# Patient Record
Sex: Female | Born: 2005 | Race: Black or African American | Hispanic: No | Marital: Single | Smoking: Never smoker
Health system: Southern US, Community
[De-identification: ages and names within clinical notes are randomized; demographics above are authoritative.]

## PROBLEM LIST (undated history)

## (undated) DIAGNOSIS — T7840XA Allergy, unspecified, initial encounter: Secondary | ICD-10-CM

## (undated) DIAGNOSIS — D573 Sickle-cell trait: Secondary | ICD-10-CM

## (undated) DIAGNOSIS — L309 Dermatitis, unspecified: Secondary | ICD-10-CM

## (undated) HISTORY — DX: Sickle-cell trait: D57.3

## (undated) HISTORY — DX: Dermatitis, unspecified: L30.9

## (undated) HISTORY — DX: Allergy, unspecified, initial encounter: T78.40XA

---
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
## 2024-01-13 ENCOUNTER — Encounter: Payer: Self-pay | Admitting: Dermatology

## 2024-01-13 DIAGNOSIS — L209 Atopic dermatitis, unspecified: Secondary | ICD-10-CM

## 2024-01-15 MED ORDER — CIBINQO 100 MG PO TABS: 100.0000 mg | ORAL_TABLET | Freq: Every day | ORAL | 6 refills | Status: AC

## 2024-01-21 ENCOUNTER — Ambulatory Visit (INDEPENDENT_AMBULATORY_CARE_PROVIDER_SITE_OTHER): Admitting: Dermatology

## 2024-01-21 DIAGNOSIS — Z79899 Other long term (current) drug therapy: Secondary | ICD-10-CM | POA: Diagnosis not present

## 2024-01-21 DIAGNOSIS — L650 Telogen effluvium: Secondary | ICD-10-CM

## 2024-01-21 DIAGNOSIS — Z7189 Other specified counseling: Secondary | ICD-10-CM

## 2024-01-21 DIAGNOSIS — L2089 Other atopic dermatitis: Secondary | ICD-10-CM

## 2024-01-21 DIAGNOSIS — L819 Disorder of pigmentation, unspecified: Secondary | ICD-10-CM

## 2024-01-21 NOTE — Progress Notes (Unsigned)
 Follow-Up Visit   Subjective  Samantha Thompson is a 18 y.o. female who presents for the following:  Atopic derm followup reports has been using cibinqo for over a month noticed improvement on arms and legs  Also less itchy  No new flares Has not noticed side effects   Photos today  Will recheck labs at next followup  Cmp cbc and lipid  The following portions of the chart were reviewed this encounter and updated as appropriate: medications, allergies, medical history  Review of Systems:  No other skin or systemic complaints except as noted in HPI or Assessment and Plan.  Objective  Well appearing patient in no apparent distress; mood and affect are within normal limits.   A focused examination was performed of the following areas: ***  Relevant exam findings are noted in the Assessment and Plan.    Assessment & Plan    ATOPIC DERMATITIS, severe even on [name]  injections - reviewed labs from 07/2023   Exam: dyschromia on the lower legs and arms with lichenification and scale.  40% BSA while on [name] .   Chronic and persistent condition with duration or expected duration over one year. Condition is symptomatic/ bothersome to patient. Not currently at goal.   Atopic dermatitis (eczema) is a chronic, relapsing, pruritic condition that can significantly affect quality of life. It is often associated with allergic rhinitis and/or asthma and can require treatment with topical medications, phototherapy, or in severe cases biologic injectable medication ([name] ; Adbry) or Oral JAK inhibitors.   Treatment Plan: Continue [name]  300mg /32mL SQ Q2W. Dupilumab  ([name] ) is a treatment given by injection for adults and children with moderate-to-severe atopic dermatitis. Goal is control of skin condition, not cure. It is given as 2 injections at the first dose followed by 1 injection ever 2 weeks thereafter.  Young children are dosed monthly.   Potential side effects include  allergic reaction, herpes infections, injection site reactions and conjunctivitis (inflammation of the eyes).  The use of [name]  requires long term medication management, including periodic office visits.   Continue [name] 0.1% cream to aa's QD-BID PRN. Topical steroids (such as triamcinolone , fluocinolone, fluocinonide, mometasone, clobetasol , halobetasol, betamethasone, hydrocortisone ) can cause thinning and lightening of the skin if they are used for too long in the same area. Your physician has selected the right strength medicine for your problem and area affected on the body. Please use your medication only as directed by your physician to prevent side effects.    Recommend gentle skin care.   Discussed Cibinqo and Rinvoq since patient continues to flare on treatment. Discussed potential side effects, and the need for screening labs. Pt would like more information about medication before deciding. Pamphlets given for Cibinqo and Rinvoq. Reviewed recent lab work and should patient want to move forward with either medications she would need a CMP, Quanti-feron, and Hep B. Pt will contact us  via MyChart if she would like to change medication.  TELOGEN EFFLUVIUM Secondary to recent severe flare of atopic dermatitis that occurred when patient was off [name]  injections.  Her hair growth has significantly improved that she has been on [name]  for a while and she is pleased.  We had done thyroid  screening and other lab screening and it was all okay. Exam: Diffuse thinning of hair, stable since last visit   Telogen effluvium is a benign, self-limited condition causing increased hair shedding usually for several months. It does not progress to baldness, and the hair eventually grows back on its own. It can  be triggered by recent illness, recent surgery, thyroid  disease, low iron stores, vitamin D  deficiency, fad diets or rapid weight loss, hormonal changes such as pregnancy or birth control pills, and some  medication. Usually the hair loss starts 2-3 months after the illness or health change. Rarely, it can continue for longer than a year. Treatments options may include oral or topical Minoxidil; Red Light scalp treatments; Biotin 2.5 mg daily and other options.   No treatment    No follow-ups on file.  IEleanor Blush, [name], am acting as scribe for Alm Rhyme, MD.   Documentation: I have reviewed the above documentation for accuracy and completeness, and I agree with the above.  Alm Rhyme, MD

## 2024-01-21 NOTE — Patient Instructions (Signed)

## 2024-01-22 ENCOUNTER — Encounter: Payer: Self-pay | Admitting: Dermatology

## 2024-01-30 DIAGNOSIS — Z79899 Other long term (current) drug therapy: Secondary | ICD-10-CM | POA: Diagnosis not present

## 2024-01-31 ENCOUNTER — Ambulatory Visit: Payer: Self-pay | Admitting: Dermatology

## 2024-01-31 LAB — COMPREHENSIVE METABOLIC PANEL WITH GFR
ALT: 10 IU/L (ref 0–32)
AST: 17 IU/L (ref 0–40)
Albumin: 4.3 g/dL (ref 4.0–5.0)
Alkaline Phosphatase: 113 IU/L — ABNORMAL HIGH (ref 42–106)
BUN/Creatinine Ratio: 10 (ref 9–23)
BUN: 7 mg/dL (ref 6–20)
Bilirubin Total: 0.2 mg/dL (ref 0.0–1.2)
CO2: 20 mmol/L (ref 20–29)
Calcium: 9.5 mg/dL (ref 8.7–10.2)
Chloride: 105 mmol/L (ref 96–106)
Creatinine, Ser: 0.69 mg/dL (ref 0.57–1.00)
Globulin, Total: 2.1 g/dL (ref 1.5–4.5)
Glucose: 77 mg/dL (ref 70–99)
Potassium: 4.4 mmol/L (ref 3.5–5.2)
Sodium: 141 mmol/L (ref 134–144)
Total Protein: 6.4 g/dL (ref 6.0–8.5)
eGFR: 128 mL/min/1.73 (ref >59)

## 2024-01-31 LAB — LIPID PANEL
Chol/HDL Ratio: 2.3 ratio (ref 0.0–4.4)
Cholesterol, Total: 159 mg/dL (ref 100–169)
HDL: 68 mg/dL (ref >39)
LDL Chol Calc (NIH): 80 mg/dL (ref 0–109)
Triglycerides: 53 mg/dL (ref 0–89)
VLDL Cholesterol Cal: 11 mg/dL (ref 5–40)

## 2024-01-31 LAB — CBC WITH DIFFERENTIAL/PLATELET
Basophils Absolute: 0 x10E3/uL (ref 0.0–0.2)
Basos: 0 %
EOS (ABSOLUTE): 0.3 x10E3/uL (ref 0.0–0.4)
Eos: 6 %
Hematocrit: 38 % (ref 34.0–46.6)
Hemoglobin: 12.2 g/dL (ref 11.1–15.9)
Immature Grans (Abs): 0 x10E3/uL (ref 0.0–0.1)
Immature Granulocytes: 0 %
Lymphocytes Absolute: 2.3 x10E3/uL (ref 0.7–3.1)
Lymphs: 42 %
MCH: 27.6 pg (ref 26.6–33.0)
MCHC: 32.1 g/dL (ref 31.5–35.7)
MCV: 86 fL (ref 79–97)
Monocytes Absolute: 0.3 x10E3/uL (ref 0.1–0.9)
Monocytes: 6 %
Neutrophils Absolute: 2.6 x10E3/uL (ref 1.4–7.0)
Neutrophils: 46 %
Platelets: 317 x10E3/uL (ref 150–450)
RBC: 4.42 x10E6/uL (ref 3.77–5.28)
RDW: 13.5 % (ref 11.7–15.4)
WBC: 5.6 x10E3/uL (ref 3.4–10.8)

## 2024-01-31 NOTE — Telephone Encounter (Addendum)
 Tried calling patient regarding lab results and treatment recommendations. No answer.  Lm for patient to return call.  ----- Message from Alm Rhyme sent at 01/31/2024 12:38 PM EST ----- Lab from 01/30/2024 including CBC; Chemistries and Lipids = ALL OK. May continue Cibinqo for severe Atopic Dermatitis. Keep 3 mos follow up appt. Please check to see if pt getting Cibinqo through insurance coverage And Make sure she has supply and refills enough for 3 mos follow up. ----- Message ----- From: Interface, [facility] Lab Results In Sent: 01/31/2024   7:36 AM EST To: Alm JAYSON Rhyme, MD

## 2024-02-04 NOTE — Telephone Encounter (Signed)
 Patient advised of lab information per Dr. Hester and [facility] is still processing the prescription. aw

## 2024-02-06 ENCOUNTER — Ambulatory Visit: Admitting: Gastroenterology

## 2024-02-06 NOTE — Progress Notes (Deleted)
 Samantha Thompson

## 2024-02-08 ENCOUNTER — Ambulatory Visit (HOSPITAL_COMMUNITY): Admission: RE | Admit: 2024-02-08 | Source: Home / Self Care | Admitting: Oral Surgery

## 2024-02-08 ENCOUNTER — Encounter (HOSPITAL_COMMUNITY): Admission: RE | Payer: Self-pay | Source: Home / Self Care

## 2024-02-08 SURGERY — DENTAL RESTORATION/EXTRACTIONS
Anesthesia: General

## 2024-02-14 ENCOUNTER — Telehealth: Payer: Self-pay

## 2024-02-14 NOTE — Telephone Encounter (Signed)
 Samples left of Cibinqo left at front desk for pick up. RX in appeals. aw

## 2024-02-27 ENCOUNTER — Telehealth

## 2024-02-27 DIAGNOSIS — L989 Disorder of the skin and subcutaneous tissue, unspecified: Secondary | ICD-10-CM

## 2024-02-29 NOTE — Progress Notes (Signed)
  Because Samantha Thompson, I feel your condition warrants further evaluation and I recommend that you be seen in a face-to-face visit.   NOTE: There will be NO CHARGE for this E-Visit   If you are having a true medical emergency, please call 911.     For an urgent face to face visit, [facility] has multiple urgent care centers for your convenience.  Click the link below for the full list of locations and hours, walk-in wait times, appointment scheduling options and driving directions:  Urgent Care - [place], [place], [place], [place], [place], [place]  [place]     Your MyChart E-visit questionnaire answers were reviewed by a board certified advanced clinical practitioner to complete your personal care plan based on your specific symptoms.    Thank you for using e-Visits.

## 2024-03-01 ENCOUNTER — Ambulatory Visit
Admission: EM | Admit: 2024-03-01 | Discharge: 2024-03-01 | Disposition: A | Discharge status: Home / Self Care | Attending: Internal Medicine | Admitting: Internal Medicine

## 2024-03-01 DIAGNOSIS — L089 Local infection of the skin and subcutaneous tissue, unspecified: Secondary | ICD-10-CM | POA: Diagnosis not present

## 2024-03-01 MED ORDER — DOXYCYCLINE HYCLATE 100 MG PO CAPS: 100.0000 mg | ORAL_CAPSULE | Freq: Two times a day (BID) | ORAL | 0 refills | Status: AC

## 2024-03-01 MED ORDER — MUPIROCIN 2 % EX OINT: 1.0000 | TOPICAL_OINTMENT | Freq: Two times a day (BID) | CUTANEOUS | 3 refills | Status: AC

## 2024-03-01 NOTE — Discharge Instructions (Addendum)
 Symptoms and physical exam findings are concerning for a possible staph skin infection given the multiple erythematous raised areas.  This could be secondary to MRSA.  Given the history of medication that potentially is suppressing the immune system, we will treat aggressively.  We will treat with the following: Doxycycline  100 mg twice daily for 7 days. Take this with food.  Mupirocin  ointment twice daily to the affected areas until resolved Apply a small amount of mupirocin  ointment to the inside of the nostrils on both sides twice daily for 5 days to help with possible colonization of staph. Recommend contacting your dermatologist next week to let them know that you have had these small infectious areas occur all over. Make sure to stay hydrated by drinking plenty of water. Return to urgent care or PCP if symptoms worsen or fail to resolve.
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
## 2024-03-01 NOTE — ED Triage Notes (Signed)
 Patient reports getting cysts under arm (in armpit) and noticing them now in various areas that are painful and hard. No fever. No injury.

## 2024-03-07 ENCOUNTER — Encounter: Payer: Self-pay | Admitting: Dermatology

## 2024-03-11 ENCOUNTER — Telehealth: Payer: Self-pay

## 2024-03-11 NOTE — Telephone Encounter (Signed)
 See telephone encounter.

## 2024-03-17 ENCOUNTER — Telehealth: Payer: Self-pay

## 2024-03-17 NOTE — Telephone Encounter (Signed)
 Patient left message that she needed more samples of Cibinqo  100mg .  I called patient back and advised I had 2 bottles of Cibinqo  100mg  Lot [id number] 12/2026 that I would leave at front desk for her to pick up./sh

## 2024-03-18 ENCOUNTER — Encounter: Payer: Self-pay | Admitting: Student

## 2024-04-14 ENCOUNTER — Telehealth: Payer: Self-pay

## 2024-04-14 NOTE — Telephone Encounter (Signed)
 Patient called stating she is out of her Cibinqo  100 mg and would like more samples. Informed patient we have samples and would leave 2 bottles at front desk for her to pick up. NDC 36460-663-85 Lot [id number]  Exp 10/208

## 2024-04-21 ENCOUNTER — Ambulatory Visit: Admitting: Dermatology

## 2024-05-05 ENCOUNTER — Ambulatory Visit: Admitting: Dermatology
